# Patient Record
Sex: Male | Born: 1970 | Hispanic: No | Marital: Single
Health system: Southern US, Community
[De-identification: ages and names within clinical notes are randomized; demographics above are authoritative.]

---

## 2017-03-10 ENCOUNTER — Ambulatory Visit (INDEPENDENT_AMBULATORY_CARE_PROVIDER_SITE_OTHER): Payer: Self-pay

## 2017-03-10 ENCOUNTER — Ambulatory Visit (HOSPITAL_COMMUNITY)
Admission: EM | Admit: 2017-03-10 | Discharge: 2017-03-10 | Disposition: A | Discharge status: Home / Self Care | Payer: Self-pay | Attending: Internal Medicine | Admitting: Internal Medicine

## 2017-03-10 ENCOUNTER — Encounter (HOSPITAL_COMMUNITY): Payer: Self-pay | Admitting: Emergency Medicine

## 2017-03-10 DIAGNOSIS — M25561 Pain in right knee: Secondary | ICD-10-CM

## 2017-03-10 MED ORDER — MELOXICAM 7.5 MG PO TABS: 7.5000 mg | ORAL_TABLET | Freq: Every day | ORAL | 0 refills | Status: AC

## 2017-03-10 NOTE — ED Notes (Signed)
Pt requesting 2 knee sleeves

## 2017-03-10 NOTE — ED Provider Notes (Signed)
[facility]    CSN: [id number] Arrival date & time: 03/10/17  1536     History   Chief Complaint Chief Complaint  Patient presents with  . Joint Swelling    HPI Leonard Walls is a 47 y.o. male.   47 year old male here for a few month history of right knee swelling.  Patient states it first started after he started working, where he was required to do long hours of standing, walking and heavy lifting.  States he has now not been working for a a few weeks, and pain has improved, but continues to have swelling.  2 days ago, started having pain with walking and weightbearing, and came in for evaluation.  Denies injury/trauma.  Has not tried anything for the symptoms. Denies fever, chills, night sweats.  Denies spreading erythema, increased warmth.       History reviewed. No pertinent past medical history.  There are no active problems to display for this patient.   History reviewed. No pertinent surgical history.     Home Medications    Prior to Admission medications   Medication Sig Start Date End Date Taking? Authorizing Provider  meloxicam (MOBIC) 7.5 MG tablet Take 1 tablet (7.5 mg total) by mouth daily. 03/10/17   Belinda FisherYu, Amy V, PA-C    Family History No family history on file.  Social History Social History   Tobacco Use  . Smoking status: Not on file  Substance Use Topics  . Alcohol use: Not on file  . Drug use: Not on file     Allergies   Patient has no known allergies.   Review of Systems Review of Systems  Reason unable to perform ROS: See HPI as above.     Physical Exam Triage Vital Signs ED Triage Vitals  Enc Vitals Group     BP 03/10/17 1703 (!) 141/83     Pulse Rate 03/10/17 1703 70     Resp 03/10/17 1703 16     Temp 03/10/17 1703 98.2 F (36.8 C)     Temp Source 03/10/17 1703 Oral     SpO2 03/10/17 1703 98 %     Weight 03/10/17 1702 200 lb (90.7 kg)     Height --      Head Circumference --      Peak Flow  --      Pain Score 03/10/17 1701 4     Pain Loc --      Pain Edu? --      Excl. in GC? --    No data found.  Updated Vital Signs BP (!) 141/83 (BP Location: Left Arm)   Pulse 70   Temp 98.2 F (36.8 C) (Oral)   Resp 16   Wt 200 lb (90.7 kg)   SpO2 98%   Physical Exam  Constitutional: He is oriented to person, place, and time. He appears well-developed and well-nourished. No distress.  HENT:  Head: Normocephalic and atraumatic.  Eyes: Conjunctivae are normal. Pupils are equal, round, and reactive to light.  Musculoskeletal:  Mild generalized swelling of the right knee.  No erythema, increased warmth.  No obvious tenderness on palpation.  Full range of motion of knee.  Strength normal and equal bilaterally.  Sensation intact and equal bilaterally.  Neurological: He is alert and oriented to person, place, and time.     UC Treatments / Results  Labs (all labs ordered are listed, but only abnormal results are displayed) Labs Reviewed - No data to  display  EKG  EKG Interpretation None       Radiology Dg Knee Complete 4 Views Right  Result Date: 03/10/2017 CLINICAL DATA:  47 year old male with pain and swelling with weight-bearing for 1 month. No known injury. EXAM: RIGHT KNEE - COMPLETE 4+ VIEW COMPARISON:  None. FINDINGS: Possible small joint effusion. Joint spaces and alignment within normal limits. Mild degenerative spurring, most pronounced along the undersurface of the patella. No acute osseous abnormality identified. IMPRESSION: Possible small joint effusion. Mild degenerative spurring. No acute osseous abnormality identified. Electronically Signed   By: Odessa Fleming M.D.   On: 03/10/2017 17:59    Procedures Procedures (including critical care time)  Medications Ordered in UC Medications - No data to display   Initial Impression / Assessment and Plan / UC Course  I have reviewed the triage vital signs and the nursing notes.  Pertinent labs & imaging results that  were available during my care of the patient were reviewed by me and considered in my medical decision making (see chart for details).    X-ray with small joint effusion and mild degenerative spurring.  Start Mobic as directed.  Ice compress, elevation, knee sleeve.  Follow-up with PCP for further evaluation. Patient expresses understanding and agrees to plan.  Final Clinical Impressions(s) / UC Diagnoses   Final diagnoses:  Acute pain of right knee    ED Discharge Orders        Ordered    meloxicam (MOBIC) 7.5 MG tablet  Daily     03/10/17 1808       Belinda Fisher, PA-C 03/10/17 1814

## 2017-03-10 NOTE — ED Triage Notes (Signed)
PT reports bilateral knee swelling for a few weeks. PT has never experienced this before.

## 2017-03-10 NOTE — Discharge Instructions (Signed)
X-ray showed some degenerative changes that could indicate arthritis.  Start Mobic as directed.  Ice compress, elevation.  Knee sleeve to help with swelling and pain.  Follow-up with PCP for further evaluation and management.

## 2018-09-28 ENCOUNTER — Other Ambulatory Visit: Payer: Self-pay

## 2018-09-28 DIAGNOSIS — Z20822 Contact with and (suspected) exposure to covid-19: Secondary | ICD-10-CM

## 2018-09-29 LAB — NOVEL CORONAVIRUS, NAA: SARS-CoV-2, NAA: NOT DETECTED

## 2019-09-25 IMAGING — DX DG KNEE COMPLETE 4+V*R*
5 series · 5 of 5 positions shown · non-contrast
Comparison: None.

CLINICAL DATA: 47-year-old male with pain and swelling with
weight-bearing for 1 month. No known injury.

EXAM:
RIGHT KNEE - COMPLETE 4+ VIEW

[knee ap]
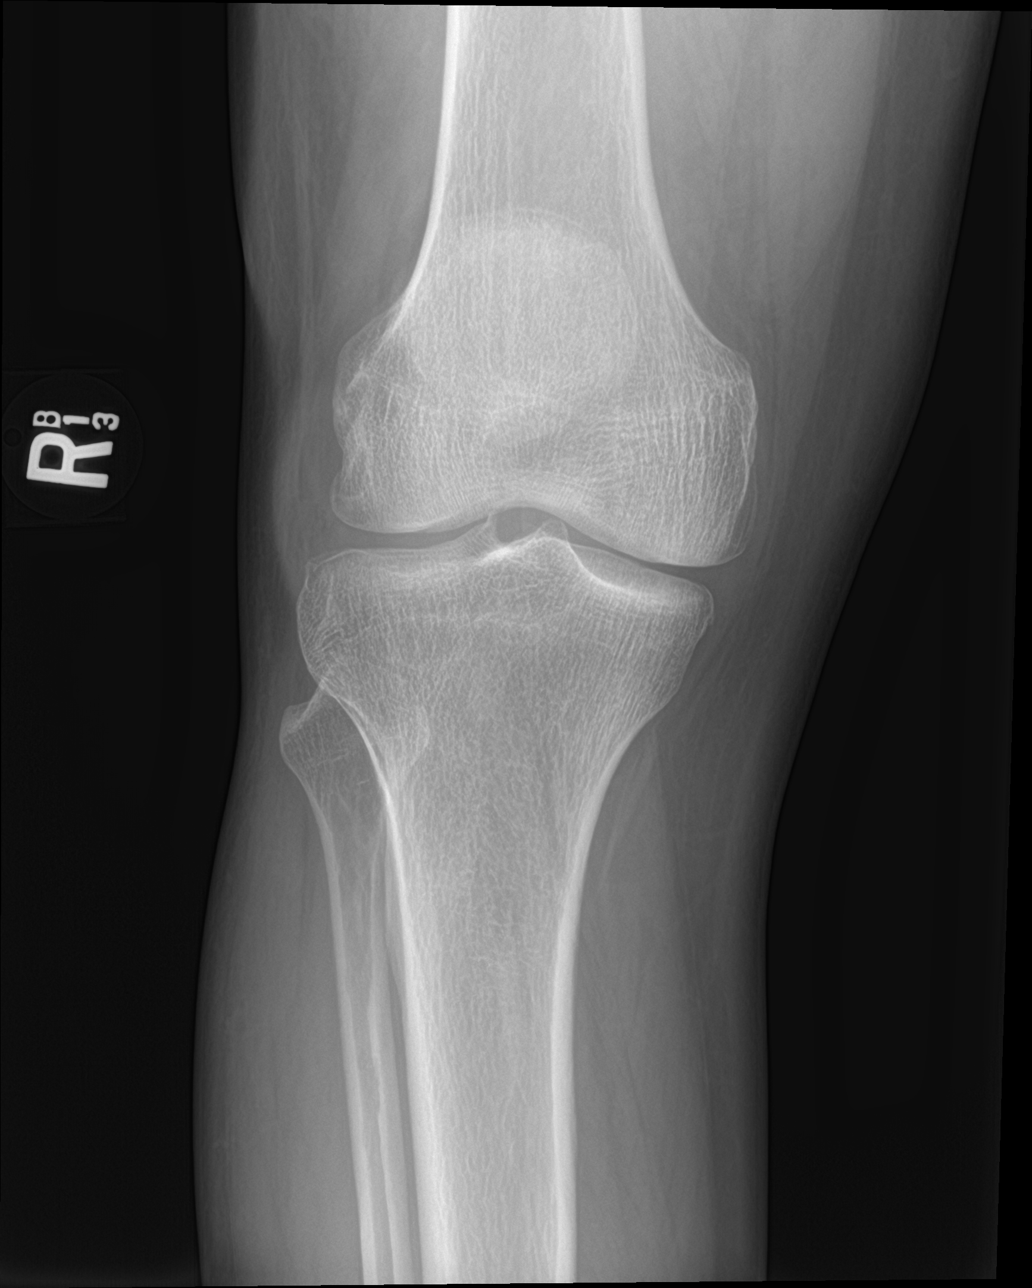

[knee obl (1 of 2)]
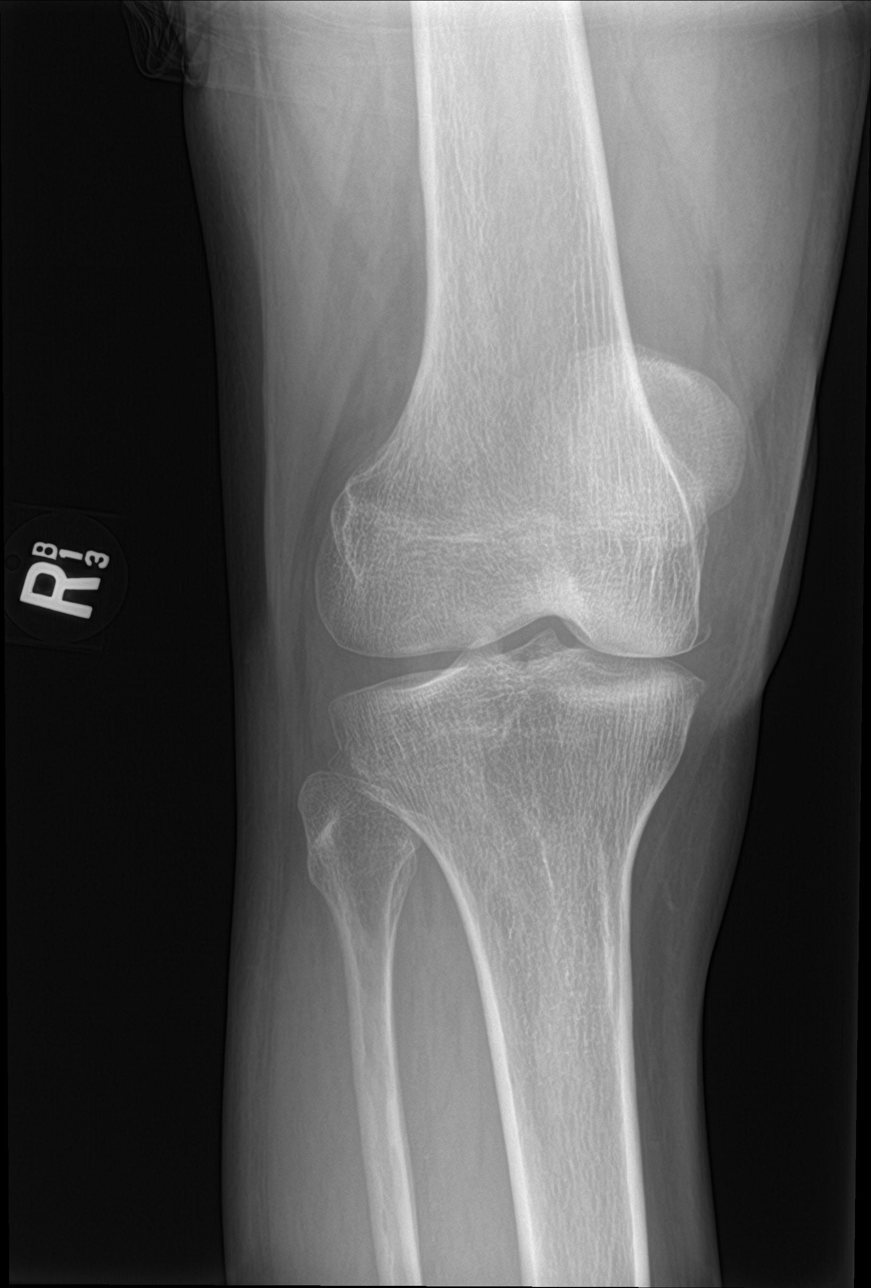

[knee obl (2 of 2)]
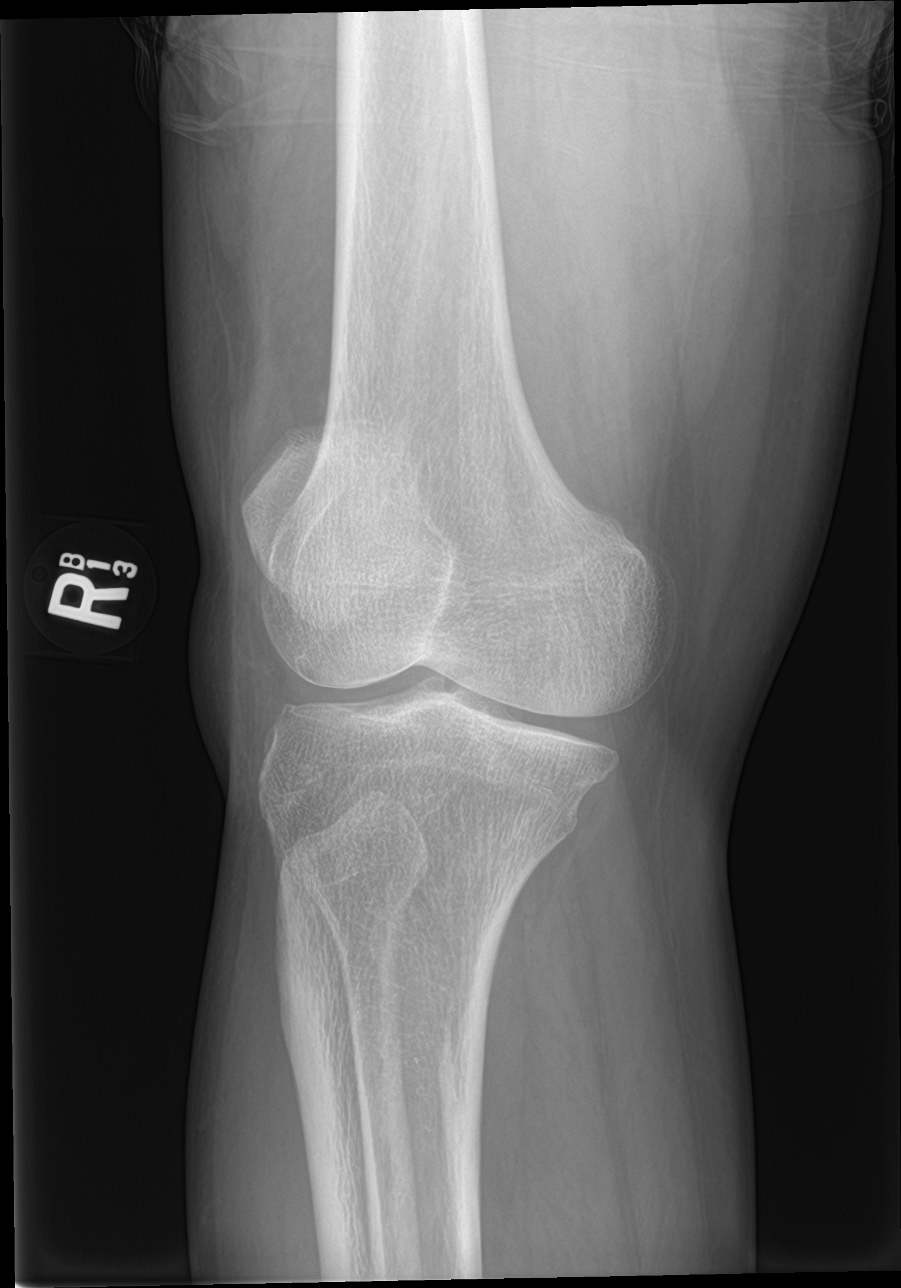

[knee lat]
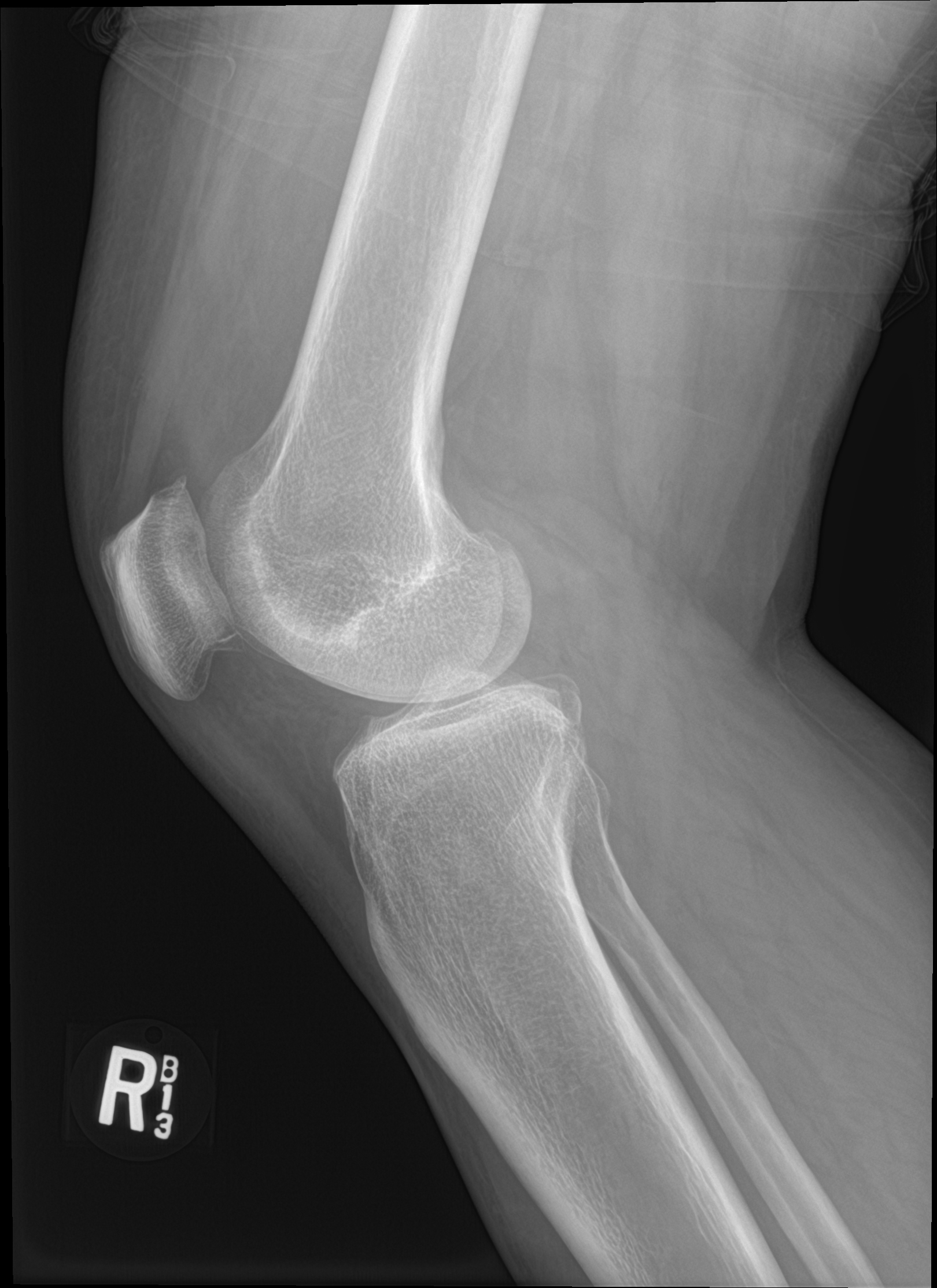

[knee sunrise]
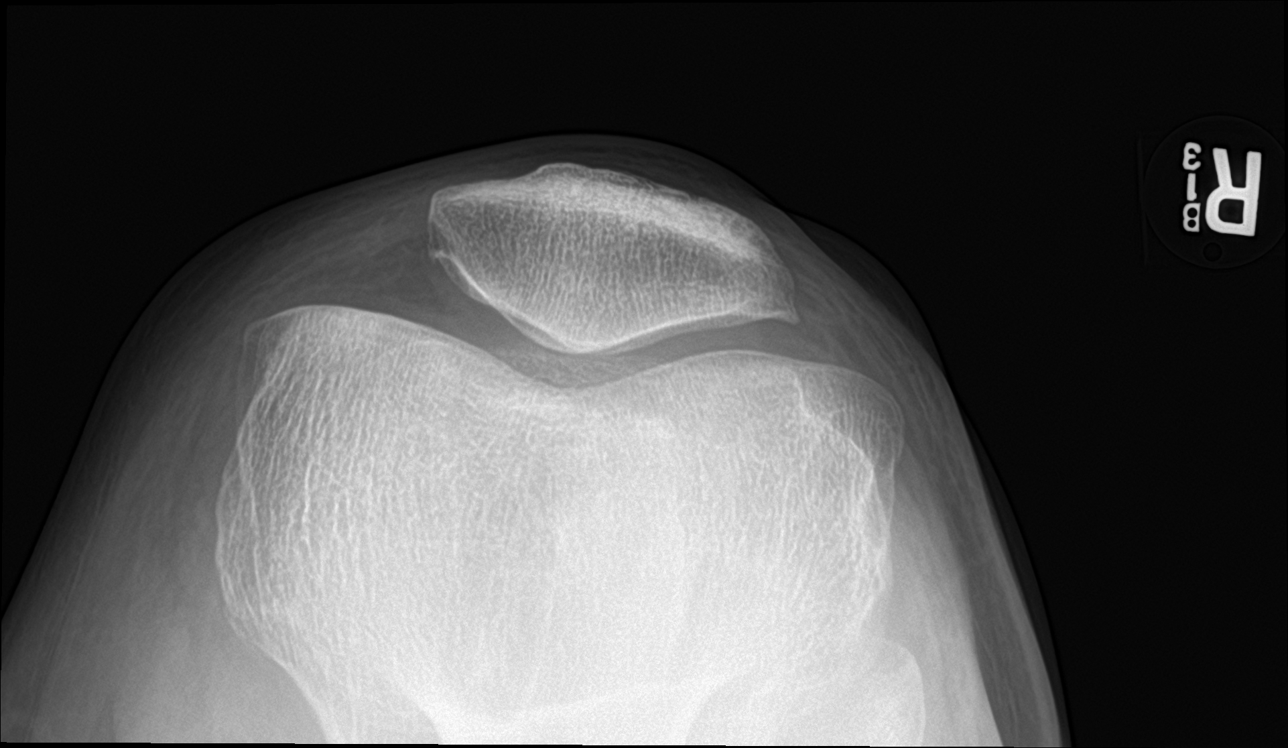

[5 of 5 positions shown; findings below may reference images not displayed]

FINDINGS: Possible small joint effusion. Joint spaces and alignment within
normal limits. Mild degenerative spurring, most pronounced along the
undersurface of the patella. No acute osseous abnormality
identified.
IMPRESSION: Possible small joint effusion. Mild degenerative spurring. No acute
osseous abnormality identified.
